# Patient Record
Sex: Female | Born: 1975 | Race: White | Hispanic: No | Marital: Married
Health system: Midwestern US, Academic
[De-identification: ages and names within clinical notes are randomized; demographics above are authoritative.]

---

## 2017-05-03 MED ORDER — FLECAINIDE 50 MG PO TAB
50 mg | ORAL_TABLET | Freq: Two times a day (BID) | ORAL | 11 refills | 30.00000 days | Status: AC
Start: 2017-05-03 — End: ?

## 2017-08-27 ENCOUNTER — Ambulatory Visit: Admit: 2017-08-27 | Discharge: 2017-08-28 | Payer: BC Managed Care – PPO

## 2017-08-27 ENCOUNTER — Encounter: Admit: 2017-08-27 | Discharge: 2017-08-27 | Payer: BC Managed Care – PPO

## 2017-08-27 DIAGNOSIS — Z720 Tobacco use: ICD-10-CM

## 2017-08-27 DIAGNOSIS — G43909 Migraine, unspecified, not intractable, without status migrainosus: ICD-10-CM

## 2017-08-27 DIAGNOSIS — I471 Supraventricular tachycardia: Principal | ICD-10-CM

## 2017-08-27 DIAGNOSIS — I4891 Unspecified atrial fibrillation: Principal | ICD-10-CM

## 2017-08-27 DIAGNOSIS — R002 Palpitations: ICD-10-CM

## 2017-08-27 DIAGNOSIS — I341 Nonrheumatic mitral (valve) prolapse: ICD-10-CM

## 2017-08-27 DIAGNOSIS — E876 Hypokalemia: ICD-10-CM

## 2017-08-27 DIAGNOSIS — F418 Other specified anxiety disorders: ICD-10-CM

## 2017-08-27 DIAGNOSIS — G2581 Restless legs syndrome: ICD-10-CM

## 2017-08-27 MED ORDER — LIDOCAINE HCL 2 % MM JELP
Freq: Once | TOPICAL | 0 refills | Status: CN
Start: 2017-08-27 — End: ?

## 2017-08-27 MED ORDER — LIDOCAINE (PF) 10 MG/ML (1 %) IJ SOLN
.1-2 mL | INTRAMUSCULAR | 0 refills | Status: CN | PRN
Start: 2017-08-27 — End: ?

## 2017-08-27 MED ORDER — SODIUM CHLORIDE 0.9 % IV SOLP
INTRAVENOUS | 0 refills | Status: CN
Start: 2017-08-27 — End: ?

## 2017-08-31 ENCOUNTER — Encounter: Admit: 2017-08-31 | Discharge: 2017-08-31 | Payer: BC Managed Care – PPO

## 2017-08-31 NOTE — Telephone Encounter
I LVM for her with the following information-    Routinely patient's who have SVT ablation may return to work 3 days after procedure.  I left our # as call back if further questions/concerns

## 2017-08-31 NOTE — Telephone Encounter
-----   Message from Jeneen RinksJessica Bower sent at 08/31/2017 12:18 PM CDT -----  Regarding: RAD-Ablation questions  Patient is scheduled for ablation on 09/23/17. She needs to know how long to take off work.  Call back is [phone] okay to LM.

## 2017-09-09 ENCOUNTER — Encounter: Admit: 2017-09-09 | Discharge: 2017-09-09 | Payer: BC Managed Care – PPO

## 2017-09-09 NOTE — Progress Notes
9-20 called  [employer] 1 [phone] [id number] regarding [id number] spoke with Netta A. was told NO Pre Cert needed call REF# [id number], patients effective date 12-21-2014

## 2017-09-18 LAB — CBC
Lab: 12
Lab: 14 — ABNORMAL LOW (ref 7.0–18.7)
Lab: 270
Lab: 30
Lab: 32 — ABNORMAL LOW (ref 33.0–37.0)
Lab: 45
Lab: 6.6 mL/min (ref >60)
Lab: 92

## 2017-09-18 LAB — BASIC METABOLIC PANEL
Lab: 139 mg/dL (ref 0.4–1.00)
Lab: 4.3 mg/dL (ref 8.5–10.6)

## 2017-09-18 LAB — MAGNESIUM: Lab: 2.2 mL/min (ref >60)

## 2017-09-19 ENCOUNTER — Encounter: Admit: 2017-09-19 | Discharge: 2017-09-19 | Payer: BC Managed Care – PPO

## 2017-09-19 DIAGNOSIS — I471 Supraventricular tachycardia: Principal | ICD-10-CM

## 2017-09-19 MED ORDER — MAGNESIUM HYDROXIDE 2,400 MG/10 ML PO SUSP
10 mL | ORAL | 0 refills | Status: CN | PRN
Start: 2017-09-19 — End: ?

## 2017-09-19 MED ORDER — ACETAMINOPHEN 325 MG PO TAB
650 mg | ORAL | 0 refills | Status: CN | PRN
Start: 2017-09-19 — End: ?

## 2017-09-19 MED ORDER — ALUMINUM-MAGNESIUM HYDROXIDE 200-200 MG/5 ML PO SUSP
30 mL | ORAL | 0 refills | Status: CN | PRN
Start: 2017-09-19 — End: ?

## 2017-09-19 NOTE — H&P (View-Only)
Patient presents for procedure. Please see most recent H/P from 09/07 below.     Sandy Parkinson, APRN-C  Pager [phone]     _____________________________________________________________________________    Office Visit     08/27/2017  [facility]   Dendi, Raghuveer, MD   Cardiology   SVT (supraventricular tachycardia) (HCC) +2 more   Dx   Atrial fibrillation , SVT; Referred by Sharlyne Pacas, DO   Reason for Visit    Progress Notes   Jen Mow, MD (Physician) ??? ??? Cardiology ??? ??? 08/27/17 1340 ??? ??? Signed      Date of Service: 08/27/2017  ???  Martisha Toulouse is a 41 y.o. female.     ???  HPI  I had the pleasure of seeing Ms. Quenten Raven in our office today for cardiac electrophysiology FOLLOW UP consultation in response to a request from her primary physician, Dr. Frederica Kuster, regarding recurrent palpitations and a history of SVT and possible atrial fibrillation.   ???  As you recall, she is an extremely delightful 41 year old woman who has a known history of longstanding syncope suggestive of neurocardiogenic syncope, history of tobacco abuse of 1 pack per day for the last 13 years, and a history of anxiety disorder since age 46 requiring medication, who was originally diagnosed with a supraventricular tachycardia with rates up to 220 beats per minute and was taken to EP study and ablation in February 2017 by Dr. Ethelene Hal in [facility]. ???Based on the study and mapping, he felt she had focal atrial tachycardia coming from the lateral right atrium and it was ablated. ???However, he told her that he was only 80% successful and continued the atenolol. ???Unfortunately, she continued to have similar symptoms as before. ???She has had a Holter monitor and a recent event monitor which is ongoing. ???She is here for a second opinion.   ???  Her symptoms include skipping beats, occasional racing, tiredness. ???Frequency is variable. ???It can occur back to back in 1 day versus once a week, averaging around 3-4 times a month. ???Duration is about 15-30 seconds each time. ???She had 2 episodes yesterday. ???These occurred in the setting of excessive heat and working all day and gardening. ???The longest was probably a minute.   ???  Her event monitor is done  But not reviewed.Marland Kitchen   ???  There are no specific triggers or relieving factors for her arrhythmias.   ???  She does not have any thyroid abnormalities. ???I did not ask about any stimulants or caffeine.   ???  She had a stress test less than 5 years ago that was negative for any ischemia.   ???  May 03, 2017:  When I first saw her, I wanted to wait until she finished her event monitor and I reviewed the recordings.  In the interim, I wanted her to take 50 mg twice a day of flecainide and continue the atenolol.  I wanted her to come back for followup in a month.  I also asked her to stop smoking.   ???  She is here for a followup visit today and tells me that she continues to have palpitations.  There are 2 kinds.  One is a constant racing up to 45 seconds or longer, which is more bothersome, and subsequently she feels like she has a kick in the chest.  She also has other types of palpitations which are fluttering or skipped extra beats.  ???  ([id number])  ???  ???  IMPRESSION/PLAN:  ???  Echocardiogram was performed several times, including most recent one in February 2017, which showed normal LV function with EF 65%, normal left atrial size, and no valvular abnormalities, even though she was previously told that she had possible mitral valve prolapse.   ???  The EP study report was reviewed from January 24, 2016. ???As mentioned above, she had inducible atrial tachycardia that was nonsustained first, at 290 msec cycle length, and subsequently sustained with Isuprel. ???Mapping suggested the earliest signal in the right atrium, in the right superior aspect, and ablation was performed in that area with termination of tachycardia, but a repeat induction showed another tachycardia at 340 msec cycle length, and mapping seemed to be just lateral to the prior ablation, where ablation was performed again. ???It was felt that this could lead to damage to the sinus node. ???Therefore, further ablation was not performed.   ???  Lab data from April 2018 showed normal CBC, BUN and creatinine, and liver enzymes. ???Cholesterol was 168. ???Electrolytes were within normal limits. ???Thyroid function test was normal. ???HDL was 63, LDL was 97.   ???  I reviewed the 48-hour Holter monitor, which showed predominantly normal sinus rhythm with intermittent premature ventricular ectopy, with a total PVC burden of 4.9% and monomorphic. ???No atrial fibrillation or SVT was documented in those 48 hours. ???She had some sinus tachycardia.   ???  1. 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  2. History of symptomatic PVCs, with a low burden of 4.9% on the Holter, monomorphic.  3. Anxiety disorder. ???  4. History of neurocardiogenic syncope since age 10.  5. Tobacco abuse.   ???  I reviewed her event monitor results and strips today.  It was quite voluminous data, but we were able to screen through the tracings and clearly identified that she has very rapid narrow-complex tachycardia, consistent with supraventricular tachycardia, going around 200 beats per minute or around 280 msec cycle length.  Onset seems to be with a PAC, but no long AH interval.  Termination seems to be with a QRS.  It appears like a short RP tachycardia, although the VA interval may be slightly longer than 70 msec.   ???  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  It could be most likely AVRT with concealed bypass tract pathway, but AVNRT cannot be ruled out, especially atypical AVNRT.  It could also be atrial tachycardia from the left atrium.  Obviously, she would benefit from an EP study and ablation for a second time.  We would be ready with right atrial and left atrial mapping, as well as multipolar mapping with a PentaRay.  She will be under conscious sedation in the beginning of the study, and we will have anesthesia as a backup if needed.  She understands the benefits and risks of the procedure, including but not limited to infection, bleeding, cardiac perforation, complete heart block requiring pacemaker, etc.  She understands we may have to go through the left atrium and do a transseptal puncture if needed.  She will hold the flecainide for 3 days prior to the procedure, and atenolol on the morning of the procedure.  She is willing to go ahead, and we will schedule her accordingly.  ???  ([id number])  ???  ???  ???  ???  ???       Vitals:   ??? 08/27/17 1323 08/27/17 1338   BP: 104/64 108/66   Pulse: ??? 68  Weight: 60.3 kg (133 lb) ???   Height: 1.727 m (5' 8) ???   ???  Body mass index is 20.22 kg/m???.   ???  Past Medical History        Patient Active Problem List   ??? Diagnosis Date Noted   ??? Atrial fibrillation (HCC) 04/28/2017   ??? SVT (supraventricular tachycardia) (HCC) 04/28/2017   ??? ??? 01/24/2016 EP study with RFA by Dr. Ethelene Hal at [facility]   ???   ??? Mitral valve prolapse 04/28/2017   ??? ??? 01/24/2016 Echo Normal left ventricular size.  Normal left ventricular systolic function.  EF 65%.  Mitral valve demonstrates normal function with trace physiologic regurgitation.   ???   ??? Tobacco abuse 04/28/2017   ??? 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 Hypokalemia 04/28/2017   ??? Palpitation 04/28/2017   ??? Restless leg syndrome 04/28/2017   ??? Migraine 04/28/2017   ???  ???  No Known Allergies  Family History   Problem Relation Age of Onset   ??? Heart Attack Mother    ??? Blood Clots Mother    ??? Hypertension Mother    ??? High Cholesterol Mother    ??? Heart Surgery Maternal Uncle    ??? Heart Surgery Maternal Grandmother    ??? Heart Attack Maternal Grandmother    ??? Heart Disease Maternal Grandmother ??? Heart Surgery Maternal Uncle    ??? Heart Surgery Maternal Uncle      No past surgical history on file.  Social History     Social History   ??? Marital status: Divorced     Spouse name: N/A   ??? Number of children: N/A   ??? Years of education: N/A     Social History Main Topics   ??? Smoking status: Current Every Day Smoker     Packs/day: 1.00     Types: Cigarettes   ??? Smokeless tobacco: Never Used   ??? Alcohol use No   ??? Drug use: No   ??? Sexual activity: Not on file     Other Topics Concern   ??? Not on file     Social History Narrative   ??? No narrative on file       ???  Review of Systems   Constitution: Positive for malaise/fatigue and night sweats.   HENT: Positive for ear pain, hearing loss and tinnitus.    Eyes: Negative.    Cardiovascular: Positive for claudication, irregular heartbeat, leg swelling and near-syncope.   Respiratory: Negative.    Endocrine: Negative.    Hematologic/Lymphatic: Bruises/bleeds easily.   Skin: Negative.    Musculoskeletal: Positive for muscle cramps and neck pain.   Gastrointestinal: Negative.    Genitourinary: Positive for decreased libido and missed menses.   Neurological: Positive for light-headedness.   Psychiatric/Behavioral: Positive for memory loss. The patient has insomnia.    Allergic/Immunologic: Positive for environmental allergies.   ???  ???  Physical Exam  Patient is a moderately well-built woman who is comfortable at rest,  not in any distress.  Sclerae anicteric.  The oral mucosa is moist and pink.  Neck is  supple without any lymphadenopathy.  Lungs are clear to auscultation bilaterally.  Breath  sounds are normal.  Cardiac exam reveals normal S1, S2 with regular rate and  rhythm.  No murmurs, rubs or gallops noted.  Abdomen:  Soft, nontender, nondistended.  Bowel sounds are present.  Extremities:  No cyanosis, clubbing or edema.  Peripheral  pulses are symmetric.  Skin without any rash.  ???  ???  Cardiovascular Studies  ???  ???  Problems Addressed Today  No diagnosis found.  ??? Assessment and Plan  As above in HPI section.  ???  ???  ???  Current Medications (including today's revisions)  ??? atenolol (TENORMIN) 50 mg tablet Take 50 mg by mouth daily.   ??? cyclobenzaprine (FLEXERIL) 10 mg tablet Take 1 tablet by mouth as Needed.   ??? duloxetine DR (CYMBALTA) 30 mg capsule Take 30 mg by mouth at bedtime daily.   ??? duloxetine DR (CYMBALTA) 60 mg capsule Take 60 mg by mouth every morning.   ??? flecainide (TAMBOCOR) 50 mg tablet Take 1 tablet by mouth twice daily.   ??? hydrOXYzine pamoate (VISTARIL) 50 mg capsule Take 1 capsule by mouth at bedtime daily.   ???  ???

## 2017-09-20 ENCOUNTER — Encounter: Admit: 2017-09-20 | Discharge: 2017-09-20 | Payer: BC Managed Care – PPO

## 2017-09-20 DIAGNOSIS — I471 Supraventricular tachycardia: Principal | ICD-10-CM

## 2017-09-23 ENCOUNTER — Encounter: Admit: 2017-09-23 | Discharge: 2017-09-23 | Payer: BC Managed Care – PPO

## 2017-09-23 ENCOUNTER — Encounter: Admit: 2017-09-23 | Discharge: 2017-09-24 | Payer: BC Managed Care – PPO

## 2017-09-23 DIAGNOSIS — I341 Nonrheumatic mitral (valve) prolapse: ICD-10-CM

## 2017-09-23 DIAGNOSIS — R002 Palpitations: ICD-10-CM

## 2017-09-23 DIAGNOSIS — G2581 Restless legs syndrome: ICD-10-CM

## 2017-09-23 DIAGNOSIS — E876 Hypokalemia: ICD-10-CM

## 2017-09-23 DIAGNOSIS — Z720 Tobacco use: ICD-10-CM

## 2017-09-23 DIAGNOSIS — I471 Supraventricular tachycardia: Principal | ICD-10-CM

## 2017-09-23 DIAGNOSIS — S92309A Fracture of unspecified metatarsal bone(s), unspecified foot, initial encounter for closed fracture: ICD-10-CM

## 2017-09-23 DIAGNOSIS — G43909 Migraine, unspecified, not intractable, without status migrainosus: ICD-10-CM

## 2017-09-23 DIAGNOSIS — F418 Other specified anxiety disorders: ICD-10-CM

## 2017-09-23 DIAGNOSIS — I4891 Unspecified atrial fibrillation: Principal | ICD-10-CM

## 2017-09-23 LAB — PREGNANCY TEST-URINE
Lab: 1 % (ref 1.003–1.035)
Lab: NEGATIVE FL — AB (ref 7–11)

## 2017-09-23 LAB — POC ACTIVATED CLOTTING TIME: Lab: 157 s

## 2017-09-23 MED ORDER — SODIUM CHLORIDE 0.9 % IV SOLP
INTRAVENOUS | 0 refills | Status: DC
Start: 2017-09-23 — End: 2017-09-24

## 2017-09-23 MED ORDER — LIDOCAINE (PF) 10 MG/ML (1 %) IJ SOLN
.1-2 mL | INTRAMUSCULAR | 0 refills | Status: DC | PRN
Start: 2017-09-23 — End: 2017-09-24

## 2017-09-23 MED ORDER — DIPHENHYDRAMINE HCL 50 MG/ML IJ SOLN
25 mg | Freq: Once | INTRAVENOUS | 0 refills | Status: AC | PRN
Start: 2017-09-23 — End: ?

## 2017-09-23 MED ORDER — ONDANSETRON HCL (PF) 4 MG/2 ML IJ SOLN
4 mg | INTRAVENOUS | 0 refills | Status: DC | PRN
Start: 2017-09-23 — End: 2017-09-24

## 2017-09-23 MED ORDER — MORPHINE 2 MG/ML IV CRTG
2 mg | Freq: Once | INTRAVENOUS | 0 refills | Status: AC | PRN
Start: 2017-09-23 — End: ?

## 2017-09-23 MED ORDER — ATENOLOL 50 MG PO TAB
50 mg | Freq: Every day | ORAL | 0 refills | Status: DC
Start: 2017-09-23 — End: 2017-09-24
  Administered 2017-09-24 (×2): 50 mg via ORAL

## 2017-09-23 MED ORDER — FLECAINIDE 50 MG PO TAB
50 mg | Freq: Two times a day (BID) | ORAL | 0 refills | Status: DC
Start: 2017-09-23 — End: 2017-09-24
  Administered 2017-09-24 (×2): 50 mg via ORAL

## 2017-09-23 MED ORDER — LIDOCAINE HCL 2 % MM JELP
Freq: Once | TOPICAL | 0 refills | Status: DC
Start: 2017-09-23 — End: 2017-09-24

## 2017-09-23 MED ORDER — ALUMINUM-MAGNESIUM HYDROXIDE 200-200 MG/5 ML PO SUSP
30 mL | ORAL | 0 refills | Status: DC | PRN
Start: 2017-09-23 — End: 2017-09-24

## 2017-09-23 MED ORDER — PROTAMINE IVPB
20 mg | INTRAVENOUS | 0 refills | Status: AC | PRN
Start: 2017-09-23 — End: ?

## 2017-09-23 MED ORDER — PROTAMINE IVPB
40 mg | INTRAVENOUS | 0 refills | Status: AC | PRN
Start: 2017-09-23 — End: ?

## 2017-09-23 MED ORDER — MAGNESIUM HYDROXIDE 2,400 MG/10 ML PO SUSP
10 mL | ORAL | 0 refills | Status: DC | PRN
Start: 2017-09-23 — End: 2017-09-24

## 2017-09-23 MED ORDER — SODIUM CHLORIDE 0.9 % IV SOLP
INTRAVENOUS | 0 refills | Status: DC
Start: 2017-09-23 — End: 2017-09-24
  Administered 2017-09-23: 15:00:00 1000.000 mL via INTRAVENOUS

## 2017-09-23 MED ORDER — ACETAMINOPHEN 325 MG PO TAB
650 mg | ORAL | 0 refills | Status: DC | PRN
Start: 2017-09-23 — End: 2017-09-24

## 2017-09-23 NOTE — Progress Notes
Patient arrived on unit via ambulation accompanied by RN. Patient transferred to the bed without assistance. Frailty score equals 3  Assessment completed, refer to flowsheet for details. Orders released, reviewed, and implemented as appropriate. Oriented to surroundings, call light within reach. Plan of care reviewed.  Will continue to monitor and assess.

## 2017-09-23 NOTE — Other
Operator:  Jen Mow, MD    Procedure: EP Study; PAT RF Ablation.    Indication/Pre-Operative Diagnosis: Symptomatic paroxysmal AT.    Post-Operative Diagnosis: Same    Presenting Rhythm: SR    Accesses: 2 RFV ( 8.5 Fr & 6 Fr) , 3 LFV ( 11 Fr , 56F, 6 Fr).  No arterial access/stick. 1 Transeptal done under ICE and fluoroscopy. Heparin effect reversed at the end of the procedure with protamine.    Procedure(Findings): RA mapping and LA mapping performed. Earliest location in the upper anterolateral RA. Ablation seems to suppress the tachycardia but does not eliminate it. Several ablation lesions with adequate power delivered. Patient has significant pain. Still has NS PAT for few beats at a time. Study terminated.    Complications: None.  Specimen Removed: None.  Estimated Blood Loss: 20 cc.    Plan: Overnight monitoring. Continue home meds including Flecainide and atenolol.

## 2017-09-24 DIAGNOSIS — R55 Syncope and collapse: ICD-10-CM

## 2017-09-24 DIAGNOSIS — I471 Supraventricular tachycardia: Principal | ICD-10-CM

## 2017-09-24 LAB — CBC
Lab: 4 M/UL — ABNORMAL HIGH (ref 4.0–5.0)
Lab: 7.3 K/UL (ref 4.5–11.0)
Lab: 13 % (ref 11–15)
Lab: 13 g/dL (ref 12.0–15.0)
Lab: 206 10*3/uL (ref 150–400)
Lab: 30 pg (ref 26–34)
Lab: 33 g/dL (ref 32.0–36.0)
Lab: 4.4 M/UL (ref 4.0–5.0)
Lab: 40 % (ref 36–45)
Lab: 6.7 10*3/uL (ref 4.5–11.0)
Lab: 8 FL (ref 7–11)
Lab: 90 FL (ref 80–100)

## 2017-09-24 LAB — BASIC METABOLIC PANEL: Lab: 137 MMOL/L — ABNORMAL LOW (ref 137–147)

## 2017-09-24 MED ORDER — ATENOLOL 50 MG PO TAB
50 mg | ORAL_TABLET | Freq: Every day | ORAL | 3 refills | 33.00000 days | Status: AC
Start: 2017-09-24 — End: 2020-01-01

## 2017-09-24 NOTE — Progress Notes
Patient discharged to home with all belongings.  Discharge instructions, med reconciliation and home wound care instructions given and explained to patient and family both verbally and written.  Accompanied by mother.  No complaints of pain or discomfort.   Bilateral femoral puncture sites remain clean, dry, and intact with no evidence of a bruit or hematoma after ambulation.  Patient escorted to lobby via Transport.  Patient to follow up with [facility]) or on-call physician with any additional questions or concerns.  All contact numbers provided.  Patient and family acceptant of DC instuctions and report understanding to all information.

## 2017-09-29 ENCOUNTER — Encounter: Admit: 2017-09-29 | Discharge: 2017-09-29 | Payer: BC Managed Care – PPO

## 2017-09-29 NOTE — Telephone Encounter
-----   Message from Golda Acre, LPN sent at 24/40/1027 11:29 AM CDT -----  Regarding: RAD- Post op complications.  VM from patient on triage line.  York Spaniel that she is having a lot of complications from ablation on 09-23-17.  Said to call back on cell.

## 2017-09-29 NOTE — Telephone Encounter
Louis Meckel, LPN  [facility] Nurse Ep            VM from patient on triage line returning our call.   She did not say what symptoms she is having.   Said to call back on cell and leave her message on her secure VM of what she needs to do.      Called patient to discuss.  On Sunday night, the patient states that she had heart racing about 15 times over 20 minutes.  Patient states that she had another episode this morning.  She was just concerned because she has never had that many episodes.

## 2017-09-29 NOTE — Telephone Encounter
LV to CB

## 2017-09-29 NOTE — Telephone Encounter
I reviewed with RAD, who recommends increasing flecainide to 100mg  BID for now.  Patient is agreeable to this plan with no further concerns or questions at this time.

## 2017-11-22 ENCOUNTER — Ambulatory Visit: Admit: 2017-11-22 | Discharge: 2017-11-23 | Payer: BC Managed Care – PPO

## 2017-11-22 ENCOUNTER — Encounter: Admit: 2017-11-22 | Discharge: 2017-11-22 | Payer: BC Managed Care – PPO

## 2017-11-22 DIAGNOSIS — G2581 Restless legs syndrome: ICD-10-CM

## 2017-11-22 DIAGNOSIS — E876 Hypokalemia: ICD-10-CM

## 2017-11-22 DIAGNOSIS — I471 Supraventricular tachycardia: ICD-10-CM

## 2017-11-22 DIAGNOSIS — F418 Other specified anxiety disorders: ICD-10-CM

## 2017-11-22 DIAGNOSIS — Z720 Tobacco use: ICD-10-CM

## 2017-11-22 DIAGNOSIS — I341 Nonrheumatic mitral (valve) prolapse: ICD-10-CM

## 2017-11-22 DIAGNOSIS — R002 Palpitations: ICD-10-CM

## 2017-11-22 DIAGNOSIS — I4891 Unspecified atrial fibrillation: Principal | ICD-10-CM

## 2017-11-22 DIAGNOSIS — S92309A Fracture of unspecified metatarsal bone(s), unspecified foot, initial encounter for closed fracture: ICD-10-CM

## 2017-11-22 DIAGNOSIS — G43909 Migraine, unspecified, not intractable, without status migrainosus: ICD-10-CM

## 2018-03-24 ENCOUNTER — Encounter: Admit: 2018-03-24 | Discharge: 2018-03-24 | Payer: BC Managed Care – PPO

## 2018-03-24 DIAGNOSIS — I341 Nonrheumatic mitral (valve) prolapse: ICD-10-CM

## 2018-03-24 DIAGNOSIS — Z720 Tobacco use: ICD-10-CM

## 2018-03-24 DIAGNOSIS — E876 Hypokalemia: ICD-10-CM

## 2018-03-24 DIAGNOSIS — S92309A Fracture of unspecified metatarsal bone(s), unspecified foot, initial encounter for closed fracture: ICD-10-CM

## 2018-03-24 DIAGNOSIS — R002 Palpitations: ICD-10-CM

## 2018-03-24 DIAGNOSIS — I471 Supraventricular tachycardia: ICD-10-CM

## 2018-03-24 DIAGNOSIS — I4891 Unspecified atrial fibrillation: Principal | ICD-10-CM

## 2018-03-24 DIAGNOSIS — G2581 Restless legs syndrome: ICD-10-CM

## 2018-03-24 DIAGNOSIS — F418 Other specified anxiety disorders: ICD-10-CM

## 2018-03-24 DIAGNOSIS — G43909 Migraine, unspecified, not intractable, without status migrainosus: ICD-10-CM

## 2018-04-27 ENCOUNTER — Encounter: Admit: 2018-04-27 | Discharge: 2018-04-27 | Payer: BC Managed Care – PPO

## 2018-06-29 ENCOUNTER — Ambulatory Visit: Admit: 2018-06-29 | Discharge: 2018-06-30 | Payer: BC Managed Care – PPO

## 2018-06-29 ENCOUNTER — Encounter: Admit: 2018-06-29 | Discharge: 2018-06-29 | Payer: BC Managed Care – PPO

## 2018-06-29 DIAGNOSIS — I471 Supraventricular tachycardia: ICD-10-CM

## 2018-06-29 DIAGNOSIS — I4891 Unspecified atrial fibrillation: Principal | ICD-10-CM

## 2018-06-29 DIAGNOSIS — I341 Nonrheumatic mitral (valve) prolapse: ICD-10-CM

## 2018-06-29 DIAGNOSIS — G43909 Migraine, unspecified, not intractable, without status migrainosus: ICD-10-CM

## 2018-06-29 DIAGNOSIS — S92309A Fracture of unspecified metatarsal bone(s), unspecified foot, initial encounter for closed fracture: ICD-10-CM

## 2018-06-29 DIAGNOSIS — G2581 Restless legs syndrome: ICD-10-CM

## 2018-06-29 DIAGNOSIS — E876 Hypokalemia: ICD-10-CM

## 2018-06-29 DIAGNOSIS — F418 Other specified anxiety disorders: ICD-10-CM

## 2018-06-29 DIAGNOSIS — Z720 Tobacco use: ICD-10-CM

## 2018-06-29 DIAGNOSIS — R002 Palpitations: ICD-10-CM

## 2019-05-02 ENCOUNTER — Encounter: Admit: 2019-05-02 | Discharge: 2019-05-02 | Payer: BC Managed Care – PPO

## 2019-05-02 NOTE — Telephone Encounter
-----   Message from Golda Acre, LPN sent at 1/61/0960 12:05 PM CDT -----  Regarding: RAD- virus ?  VM from patient on triage line.  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  He was sent home for 2 weeks.  She called the [facility] covid line and was told to call RAD and get order for her to be tested due to her heart issues.  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  All her at [phone] and will have to leave message if before 4pm.

## 2019-05-02 NOTE — Telephone Encounter
1. You have had no fever for at least 72 hours (that is three full days of no fever without the use medicine that reduces fevers)  AND  2. Other symptoms have improved (for example, when your cough or shortness of breath have improved)  AND   3. At least 7 days have passed since your symptoms first appeared

## 2019-10-21 ENCOUNTER — Encounter: Admit: 2019-10-21 | Discharge: 2019-10-21 | Payer: BC Managed Care – PPO

## 2019-11-06 ENCOUNTER — Encounter: Admit: 2019-11-06 | Discharge: 2019-11-06 | Payer: BC Managed Care – PPO

## 2019-11-06 NOTE — Progress Notes
Records Request    Medical records request for continuation of care:      Please fax records to [facility]    Request records:      Most Recent Labs      Thank you,      Cardiovascular Medicine  [facility]  [address] [place] [address]  Phone:  [phone]  Fax:  [phone]

## 2019-11-07 ENCOUNTER — Encounter: Admit: 2019-11-07 | Discharge: 2019-11-07 | Payer: BC Managed Care – PPO

## 2019-12-28 ENCOUNTER — Encounter: Admit: 2019-12-28 | Discharge: 2019-12-28 | Payer: BC Managed Care – PPO

## 2020-01-01 ENCOUNTER — Encounter: Admit: 2020-01-01 | Discharge: 2020-01-01 | Payer: BC Managed Care – PPO

## 2020-01-01 DIAGNOSIS — I341 Nonrheumatic mitral (valve) prolapse: Secondary | ICD-10-CM

## 2020-01-01 DIAGNOSIS — I471 Supraventricular tachycardia: Secondary | ICD-10-CM

## 2020-01-01 MED ORDER — ATENOLOL 50 MG PO TAB
50 mg | ORAL_TABLET | Freq: Every day | ORAL | 3 refills | 33.00000 days | Status: AC
Start: 2020-01-01 — End: ?

## 2020-01-01 NOTE — Patient Instructions
1. Restart atenolol 50mg  daily.    If stress test is normal, you will restart flecainide.    We would like you to follow up in  6 months    Please schedule the following testing: STRESS TEST    In order to provide you the best care possible we ask that you follow up as below:    For NON-URGENT questions please contact us through your MyChart account.   For all medication refills please contact your pharmacy or send a request through MyChart.   For all questions that may need to be addressed urgently please call the nursing triage line at [phone] Monday - Friday 8-5 only. Please leave a detailed message with your name, date of birth, and reason for your call.    To schedule an appointment call [phone].     Please allow 10-15 business days for the results of any testing to be reviewed. Please call our office if you have not heard from a nurse within this time frame.         [facility] Nuclear Stress Test Instructions    PLEASE REPORT TO:    ____[facility] ([address], [place], [place]) - [phone]   ____[facility] ([address], [address], [place], [place]) - [phone]    ____[facility] ([address] [place], [place]) - [phone]    ____[facility] ([address], [address], [place], [place]) - [phone]   ____[facility] ([address] [place], [place]) - [phone]     [facility] Main Phone Number: [phone]     Date of Test  ____________  at ____________  for ________________________    Are you able to raise your arm up by your head for about 20 minutes? yes  Can you lie on your back for approximately 20 minutes with minimal movement? yes    The Thallium evaluation has two parts -- two nuclear scans.  The first scan is done in the morning and the second three to four hours later.   Wear comfortable clothing. Shorts or pants. (No dresses or skirts please).  Bring or wear sneakers/walking shoes if you are walking on [facility]. Please let the nuclear technologists know if you plan on flying after the test.    NO CAFFEINE 24 HOURS PRIOR TO TEST. Examples: coffee, tea, decaf drinks, cola, chocolate.     DO NOT EAT OR DRINK THE MORNING OF YOUR TEST unless otherwise instructed. (You may have a couple sips of water).    If you are a diabetic, if insulin dependent: please take one third of your insulin with two pieces of dry toast and a small juice). Bring insulin and medication with you to the test.     ___ TAKE MORNING MEDICATIONS WITH A COUPLE SIPS OF WATER PRIOR TO TEST.     HOLD THE FOLLOWING MEDICATIONS AS INDICATED BELOW:         WHAT TO DO BETWEEN THE FIRST TWO THALLIUM SCANS:  1. No strenuous exercise should be performed during this time.  2. A light lunch is permissible. The technologist will give you a list of appropriate foods.  3. Please return 15 minutes prior to the schedule of your second scan. Our nuclear technologist will tell you exactly what time to return.  4. Please do not use tobacco products in between scans.  5. After the first scan is completed, you may resume usual medications.     TEST  FINDINGS:  You will receive the results of the test within 7 business days of its completion by telephone, unless arranged differently at the time of the procedure.  If you have any questions concerning your thallium test or if you do not hear from your C[facility] physician/or nurse within 7 business days, please call the appropriate office checked above.      Instructions given by Maryann Conners, RN

## 2020-01-02 ENCOUNTER — Encounter: Admit: 2020-01-02 | Discharge: 2020-01-02 | Payer: BC Managed Care – PPO

## 2020-01-02 DIAGNOSIS — S92309A Fracture of unspecified metatarsal bone(s), unspecified foot, initial encounter for closed fracture: Secondary | ICD-10-CM

## 2020-01-02 DIAGNOSIS — I4891 Unspecified atrial fibrillation: Secondary | ICD-10-CM

## 2020-01-02 DIAGNOSIS — E876 Hypokalemia: Secondary | ICD-10-CM

## 2020-01-02 DIAGNOSIS — R002 Palpitations: Secondary | ICD-10-CM

## 2020-01-02 DIAGNOSIS — G43909 Migraine, unspecified, not intractable, without status migrainosus: Secondary | ICD-10-CM

## 2020-01-02 DIAGNOSIS — Z720 Tobacco use: Secondary | ICD-10-CM

## 2020-01-02 DIAGNOSIS — G2581 Restless legs syndrome: Secondary | ICD-10-CM

## 2020-01-02 DIAGNOSIS — F418 Other specified anxiety disorders: Secondary | ICD-10-CM

## 2020-01-02 DIAGNOSIS — I341 Nonrheumatic mitral (valve) prolapse: Secondary | ICD-10-CM

## 2020-01-02 DIAGNOSIS — I471 Supraventricular tachycardia: Secondary | ICD-10-CM

## 2020-01-05 ENCOUNTER — Encounter: Admit: 2020-01-05 | Discharge: 2020-01-05 | Payer: BC Managed Care – PPO

## 2020-05-06 ENCOUNTER — Encounter: Admit: 2020-05-06 | Discharge: 2020-05-06 | Payer: BC Managed Care – PPO

## 2022-04-20 ENCOUNTER — Encounter: Admit: 2022-04-20 | Discharge: 2022-04-20 | Payer: BC Managed Care – PPO

## 2022-04-20 NOTE — Telephone Encounter
-----   Message from Golda Acre, LPN sent at 7/0/1779  2:29 PM CDT -----  Regarding: RAD- CC  VM on triage line from [facility] with Dr. Bertram Savin office [phone] at 1:47pm.  York Spaniel that she needs right shoulder surgery and they need cardiac clearance.  Fax to #[phone].

## 2022-04-20 NOTE — Telephone Encounter
Patient is scheduled for right shoulder surgery on TBD with Dr. Selina Cooley, cardiac clearance has been requested.     Patient was last seen on 01/01/20. Patient was to follow up in 6 months after that appointment. This follow up was never scheduled. It appears several attempts were made to schedule this follow up with the patient.       Patient notified that she can not be cleared until she sees [place] back. She is scheduled for appointment with RAD on 5/8. Reviewed plan with the patient. Patient verbalized understanding and does not have any further questions or concerns. No further education requested from patient. Patient has our contact information for future needs.

## 2022-04-21 ENCOUNTER — Encounter: Admit: 2022-04-21 | Discharge: 2022-04-21 | Payer: BC Managed Care – PPO

## 2022-04-21 NOTE — Progress Notes
Records Request    Medical records request for continuation of care:    Patient has appointment  with  Dr. Wallene Huh.    Please fax records to [facility] [place] [facility] [phone]    Request records:    Noheli Melder  10.5.1977    Sleep study 02-2021    Most Recent Labs        Thank you,      Cardiovascular Medicine  [facility]  [address]  [place], [place] [address]  Phone:  [phone]  Fax:  [phone]

## 2022-04-22 ENCOUNTER — Encounter: Admit: 2022-04-22 | Discharge: 2022-04-22 | Payer: BC Managed Care – PPO

## 2022-04-27 ENCOUNTER — Encounter: Admit: 2022-04-27 | Discharge: 2022-04-27 | Payer: BC Managed Care – PPO

## 2022-04-27 DIAGNOSIS — I4891 Unspecified atrial fibrillation: Secondary | ICD-10-CM

## 2022-04-27 DIAGNOSIS — S92309A Fracture of unspecified metatarsal bone(s), unspecified foot, initial encounter for closed fracture: Secondary | ICD-10-CM

## 2022-04-27 DIAGNOSIS — G43909 Migraine, unspecified, not intractable, without status migrainosus: Secondary | ICD-10-CM

## 2022-04-27 DIAGNOSIS — G2581 Restless legs syndrome: Secondary | ICD-10-CM

## 2022-04-27 DIAGNOSIS — R0989 Other specified symptoms and signs involving the circulatory and respiratory systems: Secondary | ICD-10-CM

## 2022-04-27 DIAGNOSIS — Z720 Tobacco use: Secondary | ICD-10-CM

## 2022-04-27 DIAGNOSIS — I341 Nonrheumatic mitral (valve) prolapse: Secondary | ICD-10-CM

## 2022-04-27 DIAGNOSIS — I471 Supraventricular tachycardia: Secondary | ICD-10-CM

## 2022-04-27 DIAGNOSIS — F418 Other specified anxiety disorders: Secondary | ICD-10-CM

## 2022-04-27 DIAGNOSIS — R002 Palpitations: Secondary | ICD-10-CM

## 2022-04-27 DIAGNOSIS — E876 Hypokalemia: Secondary | ICD-10-CM

## 2022-04-27 NOTE — Progress Notes
Date of Service: 04/27/2022    Sandy Gaines is a 46 y.o. female.       HPI     I had the pleasure of seeing Ms. Sandy Gaines in our office today for cardiac electrophysiology FOLLOW UP consultation in response to a request from her primary physician, Dr. Frederica Kuster, regarding recurrent palpitations and a history of SVT and possible atrial fibrillation.   ?  As you recall, she is an extremely delightful 46 year old woman who has a known history of longstanding syncope suggestive of neurocardiogenic syncope, history of tobacco abuse of 1 pack per day for the last 13 years, and a history of anxiety disorder since age 62 requiring medication, who was originally diagnosed with a supraventricular tachycardia with rates up to 220 beats per minute and was taken to EP study and ablation in February 2017 by Dr. Ethelene Hal in [facility]. ?Based on the study and mapping, he felt she had focal atrial tachycardia coming from the lateral right atrium and it was ablated. ?However, he told her that he was only 80% successful and continued the atenolol. ?Unfortunately, she continued to have similar symptoms as before. ?She has had a Holter monitor and a recent event monitor which is ongoing. ?She is here for a second opinion.   ?  Her symptoms include skipping beats, occasional racing, tiredness. ?Frequency is variable. ?It can occur back to back in 1 day versus once a week, averaging around 3-4 times a month. ?Duration is about 15-30 seconds each time. ?She had 2 episodes yesterday. ?These occurred in the setting of excessive heat and working all day and gardening. ?The longest was probably a minute.   ?  Her event monitor is done ?But not reviewed..   ?  There are no specific triggers or relieving factors for her arrhythmias.   ?  She does not have any thyroid abnormalities. ?I did not ask about any stimulants or caffeine.   ?  She had a stress test less than 5 years ago that was negative for any ischemia.   ?  May 03, 2017: ?When I first saw her, I wanted to wait until she finished her event monitor and I reviewed the recordings. ?In the interim, I wanted her to take 50 mg twice a day of flecainide and continue the atenolol. ?I wanted her to come back for followup in a month. ?I also asked her to stop smoking.   ?  September 2018: ?I saw her for a followup visit and reviewed her event monitor results. ?She clearly seemed to have very rapid SVT, up to 200 beats per minute, and so we felt that she would benefit from a repeat EP study and ablation.   ?  We took her to the EP lab for ablation on September 23, 2017.??She had repeated runs of focal atrial tachycardia coming from the right atrium in the anterolateral aspect. ?It was not necessarily close to sinus node, but every time we put the catheter there to ablate, it would terminate with mechanical contact itself; therefore, we had to ablate empirically, and every time we ablated, she had significant pain in the shoulder. ?This limited the amount of ablation we could do, and eventually, once we did enough ablation, it seems to have reduced in frequency as well as intensity, even though she still was having very short runs at that point. ?We terminated the procedure and continued her flecainide and atenolol, and I asked her to come back for followup.   ?  She had a significant episode of increased frequency, racing, a week after the procedure, and therefore, we increase the flecainide dose. ?Since then, she says that the frequency has significantly decreased, and she only has episode on average once a week, and it only lasts 30-45 seconds. ?She is overall happy with the current status and is able to manage. 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   ?  When I saw her in December 2018, I recommended that she continue flecainide and atenolol for the time being because of a history of paroxysmal atrial tachycardia in?spite of attempted ablation. ?Since the ablation was limited by pain, we recommended that we may have to bring her back to the lab with general anesthesia.   ?  I recommended that she undergo a stress test to rule out ischemia, to continue to use the flecainide.  However, she did not make it to the appointment and did not reschedule it either.   ?  She tells me that about 7 months ago, her pharmacy changed and prescriptions did not get transferred, and so she stopped taking the medications instead of calling us.  In the absence of medications, she has been having more racing off and on, average 1-2 times per day, lasting for 20 seconds to 45 seconds.   ?  She had syncopal episode 3-4 weeks ago while she was walking inside from outside at work after opening the garage door.  She started having some racing sensation, tunnel vision, and kind of fell sideways with loss of consciousness less than 1 minute.  She had a bad headache after that and the racing resolved spontaneously, and she had fatigue for the rest of the day.   ?  Her depression came back, and therefore, she was recently restarted on Cymbalta by her PCP, Dr. Charmian Muff.   ?  She drinks 4-5 bottles of 20-ounce water every day.  She also drinks a large amount of tea 20 ounces 3 times in the morning, and 20 ounces 1 time in the evening.  She does not consume alcohol.  She continues to smoke at a half pack per day, which we recommended to cut in the past.   ?  When I last saw her in January 2021, she was noncompliant with her medications.  We restarted overall atenolol and recommended a stress test so that she can be started on flecainide.  However, she never did the stress test and in fact took herself off of flecainide and atenolol about 1 year ago.  She felt mild withdrawal symptoms initially but then did not have any problems with arrhythmias therefore she stayed off of all her medications.  She tells me that she only had 1 episode of heart racing in the last 6 months that lasted for 35 seconds.    She is here after 2 years primarily because she needs preoperative cardiovascular clearance in preparation for right rotator cuff surgery next week in [facility].  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  She continues to work and putting up to 14,000 steps per day without any limitation of chest pain or shortness of breath apparently.  Unfortunately she continues to smoke 1 pack/day.  She has tried to quit in the past but was unsuccessful.    She denies any chest pain, shortness of breath, palpitations  or leg swelling.    IMPRESSION/PLAN:    I reviewed her twelve-lead EKG which shows normal sinus rhythm at a rate of 71 bpm PR 130 QRS 68 QTc 434 ms.    Lab work shows normal BMP and CBC.  Her LDL is elevated at 145.    Echocardiogram was performed  most recent one in February 2017, which showed normal LV function with EF 65%, normal left atrial size, and no valvular abnormalities    1.  History of paroxysmal atrial tachycardia status post attempted ablation in the right anterolateral atrium in 2018.  This was limited by pain issues.  Therefore we had her on low-dose flecainide and atenolol but she has taken herself off of it for more than a year and did not have any significant arrhythmias.  2.  Coronary risk factors include tobacco use, hyperlipidemia and family history of CAD although her mom had a stent when she was in mid 50s.  3.  Patient has no active signs of ischemia on the EKG or based on the symptoms.  She is very active and putting up to 14,000 steps per day without any limitation.  Therefore she will be considered low risk for any perioperative cardiovascular complications.  If she does have any arrhythmia, beta-blockers or calcium channel blockers can be used.  4.  Hyperlipidemia: She was recommended dietary modification of the diet including low-fat and low carbohydrates and repeat lipid profile in 6 months.  5.  Tobacco use: 1 pack/day.  She has been working with the primary care physician to quit smoking but she has not been successful.  I encouraged her to try again.    I will see her back in 6 months or sooner if needed.  If she needs an antiarrhythmic drug, we cannot prescribe a 1C drug until she gets a stress test.  Alternative drug therapy like Multaq or sotalol can be utilized.  First-line therapy would be diltiazem or beta-blockers.  Thank you for letting us participate in the care of your patient.               Vitals:    04/27/22 1602   BP: 122/66   BP Source: Arm, Left Upper   Pulse: 58   SpO2: 94%   O2 Percent: 94 %   O2 Device: None (Room air)   PainSc: Five   Weight: 65.6 kg (144 lb 9.6 oz)   Height: 172.7 cm (5' 8)     Body mass index is 21.99 kg/m?Marland Kitchen     Past Medical History  Patient Active Problem List    Diagnosis Date Noted   ? Atrial tachycardia (HCC) 09/23/2017     09/23/17 Atach RFA - Ablation seems to suppress the tachycardia but does not eliminate it. Several ablation lesions with adequate power delivered. Patient has significant pain. Still has NS PAT for few beats at a time. Study terminated. Continue Flecainide and Atenolol       ? Atrial fibrillation (HCC) 04/28/2017   ? SVT (supraventricular tachycardia) (HCC) 04/28/2017     01/24/2016 EP study with RFA by Dr. Ethelene Hal at [facility]      ? Mitral valve prolapse 04/28/2017     01/24/2016 Echo Normal left ventricular size.  Normal left ventricular systolic function.  EF 65%.  Mitral valve demonstrates normal function with trace physiologic regurgitation.      ? Tobacco abuse 04/28/2017   ? 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 Hypokalemia 04/28/2017   ? Palpitation 04/28/2017   ?  Restless leg syndrome 04/28/2017   ? Migraine 04/28/2017         Review of Systems   Constitutional: Negative.   HENT: Negative.    Eyes: Negative.    Cardiovascular: Negative.    Respiratory: Negative.    Endocrine: Negative. Hematologic/Lymphatic: Negative.    Skin: Negative.    Musculoskeletal: Negative.    Gastrointestinal: Negative.    Genitourinary: Negative.    Neurological: Negative.    Psychiatric/Behavioral: Negative.    Allergic/Immunologic: Negative.        Physical Exam  Patient is a moderately well-built woman who is comfortable at rest,  not in any distress.  Sclerae anicteric.  The oral mucosa is moist and pink.  Neck is  supple without any lymphadenopathy.  Lungs are clear to auscultation bilaterally.  Breath  sounds are normal.  Cardiac exam reveals normal S1, S2 with regular rate and  rhythm.  No murmurs, rubs or gallops noted.  Abdomen:  Soft, nontender, nondistended.  Bowel sounds are present.  Extremities:  No cyanosis, clubbing or edema.  Peripheral  pulses are symmetric.  Skin without any rash.    Cardiovascular Studies      Cardiovascular Health Factors  Vitals BP Readings from Last 3 Encounters:   04/27/22 122/66   01/01/20 126/76   06/29/18 118/70     Wt Readings from Last 3 Encounters:   04/27/22 65.6 kg (144 lb 9.6 oz)   01/01/20 58.5 kg (129 lb)   06/29/18 61.2 kg (135 lb)     BMI Readings from Last 3 Encounters:   04/27/22 21.99 kg/m?   01/01/20 19.61 kg/m?   06/29/18 20.53 kg/m?      Smoking Social History     Tobacco Use   Smoking Status Every Day   ? Packs/day: 1.00   ? Years: 15.00   ? Pack years: 15.00   ? Types: Cigarettes   Smokeless Tobacco Never      Lipid Profile Cholesterol   Date Value Ref Range Status   12/08/2021 202 (H) <=200 Final     HDL   Date Value Ref Range Status   12/08/2021 63  Final     LDL   Date Value Ref Range Status   12/08/2021 145 (H) <=100 Final     Triglycerides   Date Value Ref Range Status   12/08/2021 64  Final      Blood Sugar Hemoglobin A1C   Date Value Ref Range Status   12/08/2021 4.9  Final     Glucose   Date Value Ref Range Status   12/08/2021 93  Final   07/21/2018 85  Final   09/24/2017 102 (H) 70 - 100 MG/DL Final          Problems Addressed Today  Encounter Diagnoses   Name Primary?   ? Cardiovascular symptoms Yes   ? SVT (supraventricular tachycardia) (HCC)        Assessment and Plan     As above in HPI section.         Current Medications (including today's revisions)  ? celecoxib (CELEBREX) 200 mg capsule Take one capsule by mouth daily.   ? CHOLEcalciferoL (vitamin D3) (VITAMIN D-3) 5000 unit tablet Take one tablet by mouth daily.   ? cyclobenzaprine (FLEXERIL) 10 mg tablet Take one tablet by mouth as Needed.   ? duloxetine DR (CYMBALTA) 60 mg capsule Take two capsules by mouth every morning.   ? hydrOXYzine pamoate (VISTARIL) 50 mg capsule Take one capsule  by mouth at bedtime daily.

## 2022-04-28 ENCOUNTER — Encounter: Admit: 2022-04-28 | Discharge: 2022-04-28 | Payer: BC Managed Care – PPO

## 2022-04-28 NOTE — Progress Notes
RAD cleared patient yesterday in OV note. I faxed note to Dr. Sharen Hones office at this time.

## 2022-09-01 ENCOUNTER — Encounter: Admit: 2022-09-01 | Discharge: 2022-09-01 | Payer: BC Managed Care – PPO

## 2022-09-02 ENCOUNTER — Encounter: Admit: 2022-09-02 | Discharge: 2022-09-02 | Payer: BC Managed Care – PPO

## 2022-09-02 NOTE — Telephone Encounter
-----   Message from [facility] Meerdink sent at 09/02/2022  7:34 AM CDT -----  Regarding: RAD-RC @ 4:50PM

## 2022-09-02 NOTE — Telephone Encounter
Called back and left VM. It does not look like anyone called her.

## 2023-03-03 ENCOUNTER — Encounter: Admit: 2023-03-03 | Discharge: 2023-03-03 | Payer: BC Managed Care – PPO

## 2023-03-04 ENCOUNTER — Encounter: Admit: 2023-03-04 | Discharge: 2023-03-04 | Payer: BC Managed Care – PPO

## 2023-03-04 NOTE — Progress Notes
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  Sandy Gaines that she had ECG done, and is wearing 72 hour heart monitor.  She is scheduled for right shoulder on 03-30-23 with Dr. Drema Dallas with [facility].  Said that she is having irregular HR and feels it.  August appointment will not cut it, she needs seen sooner.  [name] needs fax # to send records and fax # to get records from [place].  Call [place] at [phone].

## 2023-03-04 NOTE — Progress Notes
Spoke to [place].  Requested information provided.  Discussed her sooner OV can be scheduled with APP.  Will follow-up once we receive EKG's if indicated.      Patient notified.  Offered OV with Mickle Mallory, APRN on 3/28.  Patient ageeable to time/date/location.

## 2023-03-04 NOTE — Progress Notes
RC to both patient and [name].  LVM to CB.

## 2023-03-04 NOTE — Progress Notes
-----   Message -----  From: Louis Meckel, LPN  Sent: [id number]   4:30 PM CDT  To: Cvm Nurse Ep Team C  Subject: RAD- CC                                          VM on triage line from patient at 4:23pm today.  She is having surgery on 03-30-23 and needs cardiac clearance.  She saw PCP and they did 4 EKG and they did not like them.  They are having her wear heart monitor.  I need to see RAD as I'm having heart issues.  Call her at [phone].

## 2023-03-04 NOTE — Progress Notes
RAD- RC at 3:01pm from [facility]. Call her at [phone].

## 2023-03-04 NOTE — Progress Notes
RAD- call from Jerene Dilling at 3:59pm, she went to answer your call and hung up on you. So sorry. Call her at [phone].

## 2023-03-04 NOTE — Progress Notes
!  STAT!    Request for the following medical records, for the purpose Continuity of Care.     Please send the following:     EKG's       Please Fax to:   FAX#: [phone]  Attn: Dola Argyle

## 2023-03-04 NOTE — Progress Notes
RC. Unable to LVM.

## 2023-03-11 ENCOUNTER — Encounter: Admit: 2023-03-11 | Discharge: 2023-03-11 | Payer: BC Managed Care – PPO

## 2023-03-11 NOTE — Progress Notes
STAT     Request for the following medical records, for the purpose Continuity of Care.     Please send the following:     Most Recent Holter Monitor report with tracings     Please Fax to:   FAX#: [phone]  Attn: Judeen Hammans

## 2023-03-17 ENCOUNTER — Encounter: Admit: 2023-03-17 | Discharge: 2023-03-17 | Payer: BC Managed Care – PPO

## 2023-03-17 ENCOUNTER — Ambulatory Visit: Admit: 2023-03-17 | Discharge: 2023-03-17 | Payer: Worker's Compensation

## 2023-03-17 DIAGNOSIS — I471 SVT (supraventricular tachycardia) (HCC): Secondary | ICD-10-CM

## 2023-03-17 DIAGNOSIS — Z01810 Encounter for preprocedural cardiovascular examination: Secondary | ICD-10-CM

## 2023-03-17 DIAGNOSIS — Z720 Tobacco use: Secondary | ICD-10-CM

## 2023-03-17 DIAGNOSIS — R002 Palpitations: Secondary | ICD-10-CM

## 2023-03-17 DIAGNOSIS — G2581 Restless legs syndrome: Secondary | ICD-10-CM

## 2023-03-17 DIAGNOSIS — E876 Hypokalemia: Secondary | ICD-10-CM

## 2023-03-17 DIAGNOSIS — F418 Other specified anxiety disorders: Secondary | ICD-10-CM

## 2023-03-17 DIAGNOSIS — I341 Nonrheumatic mitral (valve) prolapse: Secondary | ICD-10-CM

## 2023-03-17 DIAGNOSIS — G43909 Migraine, unspecified, not intractable, without status migrainosus: Secondary | ICD-10-CM

## 2023-03-17 DIAGNOSIS — I4891 Unspecified atrial fibrillation: Secondary | ICD-10-CM

## 2023-03-17 DIAGNOSIS — Z79899 Other long term (current) drug therapy: Secondary | ICD-10-CM

## 2023-03-17 DIAGNOSIS — S92309A Fracture of unspecified metatarsal bone(s), unspecified foot, initial encounter for closed fracture: Secondary | ICD-10-CM

## 2023-03-17 MED ORDER — METOPROLOL SUCCINATE 25 MG PO TB24
25 mg | ORAL_TABLET | Freq: Every day | ORAL | 3 refills | 90.00000 days | Status: AC
Start: 2023-03-17 — End: ?

## 2023-03-17 NOTE — Patient Instructions
Follow-Up:    -Thank you for allowing [place] to participate in your care today. Your After Visit Summary is being completed by Lieutenant Diego, RN.    -We would like you to follow up in  3 months with Dr. Wallene Huh  -The schedule is released approximately 4-5 months in advance. You will be called by our scheduling department to make an appointment and you will also receive a notification via MyChart to self-schedule.  However, if you would like to call to make this appointment, please call [phone].    -Please schedule the following testing at check out, or by calling our scheduling line: STRESS TEST    Changes From Today's Office Visit    - Start Metoprolol Succinate 25mg  once a day.  - Schedule Stress Test - you can have this in [place]    [facility]    Nuclear Stress Test Instructions    Your cardiologist has asked that you have a nuclear stress test (also known as a Myocardial Perfusion Imaging (MPI) test.    This evaluation of your heart muscle consists of two sets of nuclear images and either a Treadmill stress test or a chemical stress test, decided by you and your physician.    You will get an IV placed in your arm for the test.    You will need to be able to raise your arm up by your head for about 20 minutes and lie on your back for about 10 minutes.  Please discuss this with your doctor or talk with the nuclear technologist or nurse if these are a problem for you.    It is recommended not to schedule any other appointment for the same day.      Wear comfortable clothing and walking shoes if you are walking on the treadmill.  Women should wear shorts or comfortable pants instead of dresses.   Sweatshirts or T-shirts work really well for imaging.    If you have a Zio Patch cardiac monitor in place, please reschedule your nuclear stress test after your monitoring period is complete. If you would like to proceed with the nuclear stress test during the monitoring time, the patch will have to be removed and the data will be lost; we can place a new patch following testing.       If you have a cardiac monitor with replacement patches, please inform the nurse prior to your appointment.  The monitor will need to be removed during testing. Please bring replacement patches with you so that we can resume monitoring following your test.     There may be enough time to leave to get a snack after the stress portion of the test.   The Technologist will tell you what time to return for the second set of images.    PLEASE NO CAFFEINE 24 HOURS PRIOR TO TEST:  Examples include coffee, tea, decaffeinated drinks, colas, [facility], Dr. Reino Kent.  Some orange sodas and root beers have caffeine, please check.  No energy drinks, Excedrin, Midol, or any foods CONTAINING CHOCOLATE.   Consuming Caffeine may postpone your test.    PLEASE DO NOT EAT OR DRINK THE MORNING OF YOUR TEST.  Water is ok to drink with your morning medications.    Please hold these medications the day of test: Metoprolol    DIABETIC PATIENTS:  if insulin dependent:  please take one third of your insulin with two pieces of dry toast and a small juice.  Bring  remaining 2/3   insulin and oral diabetic medications with you to your test.    PLEASE NO TOBACCO PRODUCTS BETWEEN SCANS  You will NOT need a driver for this test.  But are welcome to bring a visitor with you.   Visitors will not be able to accompany you back to stress room.   Please do not bring children to the nuclear stress test.    TEST FINDINGS:   You will receive the results of the test within 7 business days of completion of this test by telephone. If you have any questions concerning your nuclear stress test or if you do not hear from your cardiologist/or nurse with 7 business days, please call our office.    Contacting our office:    -For NON-URGENT questions please contact us via message through your MyChart account.   -For all medication refills please contact your pharmacy or send a request through MyChart.     -For all questions that may need to be addressed urgently please call the [facility] nursing triage line at [phone] Monday - Friday 8am-5pm only. Please leave a detailed message with your name, date of birth, and reason for your call.  If your message is received before 3:30pm, every effort will be made to call you back the same day.  Please allow time for [place] to review your chart prior to call back.     -Should you have an urgent concern over the weekend/nights, the on-call triage line is [phone].    Doylene Canning team fax number: [phone]    -You may receive a survey in the upcoming weeks from [facility]. Your feedback is important to [place] and helps [place] continue to improve patient care and patient satisfaction.     -Please feel free to call our Financial Department at [phone] with any questions or concerns about estimated cost of testing or imaging ordered today. We are happy to provide CPT codes upon request.    Results & Testing Follow Up:    -Please allow 5-7 business days for the results of any testing to be reviewed. Please call our office if you have not heard from a nurse within this time frame.    -Should you choose to complete testing at an outside facility, please contact our office after completion of testing so that we can ensure that we have received results for your provider to review.    Lab and test results:  As a part of the CARES act, starting 03/21/2020, some results will be released to you via MyChart immediately and automatically.  You may see results before your provider sees them; however, your provider will review all these results and then they, or one of their team, will notify you of result information and recommendations.   Critical results will be addressed immediately, but otherwise, please allow [place] time to get back with you prior to you reaching out to [place] for questions.  This will usually take about 72 hours for labs and 5-7 days for procedure test results.

## 2023-03-17 NOTE — Progress Notes
Faxed OV note to [place], [place] at [phone] and Dr. Elray Buba from surgery for cardiac clearance

## 2023-03-17 NOTE — Progress Notes
Cardiovascular Medicine       Date of Service: 03/17/2023      HPI     Sandy Gaines is a 47 y.o. female who was seen today in the Cardiovascular Medicine Clinic at [facility] at our [facility] office.     She presents today for preoperative restratification.  She is a patient of Dr. Wallene Huh.    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  She was originally diagnosed with a supraventricular tachycardia with rates up to 220 beats per minute and was taken to EP study and ablation in February 2017 by Dr. Ethelene Hal in [facility].  She had another ablation at [facility] in 2018 for focal atrial tachycardia.  She was started on flecainide and atenolol but this was discontinued by the patient sometime in 2022.  She continues to have palpitations and on a recent visit with her primary care physician, and ECG showed frequent premature ventricular contractions so a Holter monitor was ordered.  On reviewing this, it shows 5.8% PVC burden and 2 very short runs of atrial tachycardia but otherwise normal sinus rhythm.    She is fairly asymptomatic.  She is active and has no limitations to activity from chest pain or shortness of breath.  Her activity is mostly limited by shoulder pain.  She denies any frequent lightheadedness, recent syncope.  She reportedly had a stress test several years ago and was on flecainide and atenolol.  Per Dr. Johnanna Schneiders most recent note, if she would require flecainide again, a stress test should be performed prior.         ECG: Normal sinus rhythm    Transthoracic echocardiogram: 2017  . Normal left ventricular size. Normal left ventricular systolic function. LV Ejection   Fraction was 65 %. The left ventricular ejection fraction was an estimation.   2. The mitral valve demonstrates normal function with trace physiologic regurgitation.   3. Normal pericardium with no significant pericardial effusion.         Holter 03/15:  1.  Normal sinus rhythm  2.  PVCs is 5.8% burden  3.  Infrequent PACs with 2 short runs of atrial tachycardia        Assessment & Plan   47 y.o. female patient with the following medical problems:    Preoperative restratification.  History of supraventricular tachycardia.  Premature ventricular contractions.  History of neurocardiogenic syncope.  History of tobacco abuse.    Given good functional status (greater than 4 METS) and no limiting symptoms, no further cardiac testing is required to evaluate preoperative cardiovascular risk.  RCRI: Class I risk (3.9% 30-day risk of death, MI, cardiac arrest)  Will order a treadmill stress MPI in order to guide antiarrhythmic therapy.  Given that she continues to have premature ventricular contractions, she would likely benefit from restarting flecainide.  In the interim, will start her on metoprolol succinate 25 mg daily.  This should not preclude her from getting surgery and can be performed after her shoulder surgery if required.    Will have her follow-up with Dr. Wallene Huh to further discuss management of PVCs.         Past Medical History  Patient Active Problem List    Diagnosis Date Noted    Atrial tachycardia (HCC)  09/23/2017     09/23/17 Atach RFA - Ablation seems to suppress the tachycardia but does not eliminate it. Several ablation lesions with adequate power delivered. Patient has significant pain. Still has NS PAT for few beats at a time. Study terminated. Continue Flecainide and Atenolol  03/05/2023-Holter Monitor:Normal sinus rhythm. PVCs is 5.8% burden. Infrequent PACs with 2 short runs of atrial tachycardia.       Atrial fibrillation (HCC) 04/28/2017    SVT (supraventricular tachycardia) (HCC) 04/28/2017     01/24/2016 EP study with RFA by Dr. Ethelene Hal at [facility]       Mitral valve prolapse 04/28/2017     01/24/2016 Echo Normal left ventricular size.  Normal left ventricular systolic function.  EF 65%.  Mitral valve demonstrates normal function with trace physiologic regurgitation.       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     Review of Systems  14 point review of systems negative except as above.    Vitals:    03/17/23 0739   BP: 120/80   Pulse: 76   SpO2: 96%   PainSc: Zero   Weight: 68 kg (150 lb)   Height: 172.7 cm (5' 8)     Body mass index is 22.81 kg/m?Marland Kitchen     Physical Exam  General Appearance: no acute distress  HEENT: EOMI, mucous membranes moist, oropharynx is clear  Neck Veins: neck veins are flat & not distended  Carotid Arteries: no bruits  Chest Inspection: chest is normal in appearance  Auscultation/Percussion: lungs clear to auscultation, no rales, rhonchi, or wheezing  Cardiac Rhythm: regular rhythm & normal rate  Cardiac Auscultation: Normal S1 & S2, no S3 or S4, no rub  Murmurs: no cardiac murmurs  Abdominal Exam: soft, non-tender, normal bowel sounds, no masses or bruits  Abdominal aorta: nonpalpable   Liver & Spleen: no organomegaly  Extremities: no lower extremity edema; palpable distal pulses  Skin: warm & intact  Neurologic Exam: oriented to time, place and person; no focal neurologic deficits       Cardiovascular Studies        Cardiovascular Health Factors  Vitals BP Readings from Last 3 Encounters:   03/17/23 120/80   04/27/22 122/66   01/01/20 126/76     Wt Readings from Last 3 Encounters:   03/17/23 68 kg (150 lb)   04/27/22 65.6 kg (144 lb 9.6 oz)   01/01/20 58.5 kg (129 lb)     BMI Readings from Last 3 Encounters:   03/17/23 22.81 kg/m?   04/27/22 21.99 kg/m?   01/01/20 19.61 kg/m? Smoking Social History     Tobacco Use   Smoking Status Every Day    Current packs/day: 1.00    Average packs/day: 1 pack/day for 15.0 years (15.0 ttl pk-yrs)    Types: Cigarettes   Smokeless Tobacco Never      Lipid Profile Cholesterol   Date Value Ref Range Status   12/08/2021 202 (H) <=200 Final     HDL   Date Value Ref Range Status   12/08/2021 63  Final     LDL   Date Value Ref Range Status   12/08/2021 145 (H) <=100 Final     Triglycerides  Date Value Ref Range Status   12/08/2021 64  Final      Blood Sugar Hemoglobin A1C   Date Value Ref Range Status   12/08/2021 4.9  Final     Glucose   Date Value Ref Range Status   12/08/2021 93  Final   07/21/2018 85  Final   09/24/2017 102 (H) 70 - 100 MG/DL Final        ASCVD Risk Assessment:     ASCVD 10-year risk calculated: The 10-year ASCVD risk score (Arnett DK, et al., 2019) is: 2%*    Values used to calculate the score:      Age: 67 years      Sex: Female      Is Non-Hispanic African American: No      Diabetic: No      Tobacco smoker: Yes      Systolic Blood Pressure: 120 mmHg      Is BP treated: No      HDL Cholesterol: 63 mg/dL*      Total Cholesterol: 202 mg/dL*      * - Cholesterol units were assumed for this score calculation     LDL 70-189, if ASCVD 10-y risk is >7.5%, high to moderate-intensity statin therapy is recommended  Diabetes with ASCVD 10-y risk >7.5%, high-intensity statin therapy is recommended.  Diabetes with ASCVD 10-y risk <7.5%, moderate-intensity statin therapy is recommended.      Current Medications (including today's revisions)   CHOLEcalciferoL (vitamin D3) (VITAMIN D-3) 5000 unit tablet Take one tablet by mouth daily.    cyclobenzaprine (FLEXERIL) 10 mg tablet Take one tablet by mouth as Needed.    duloxetine DR (CYMBALTA) 60 mg capsule Take two capsules by mouth every morning.         Orpah Cobb MD  Cardiovascular Medicine.

## 2023-04-26 ENCOUNTER — Encounter: Admit: 2023-04-26 | Discharge: 2023-04-26 | Payer: BC Managed Care – PPO

## 2023-05-03 ENCOUNTER — Encounter: Admit: 2023-05-03 | Discharge: 2023-05-03 | Payer: BC Managed Care – PPO

## 2023-05-24 IMAGING — CR [ID]
3 series · 3 of 3 positions shown · non-contrast
Comparison: none

[x foot ap right]
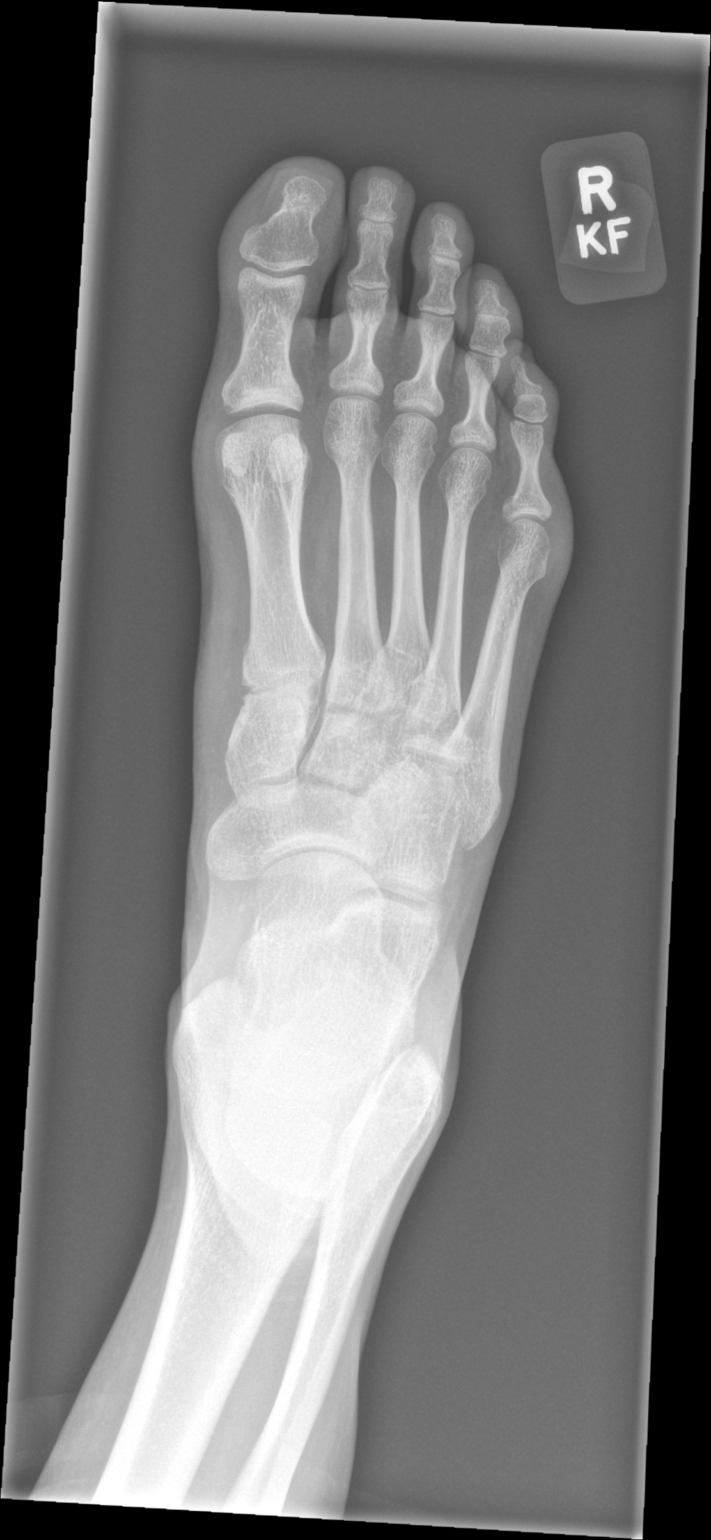

[x foot obl right]
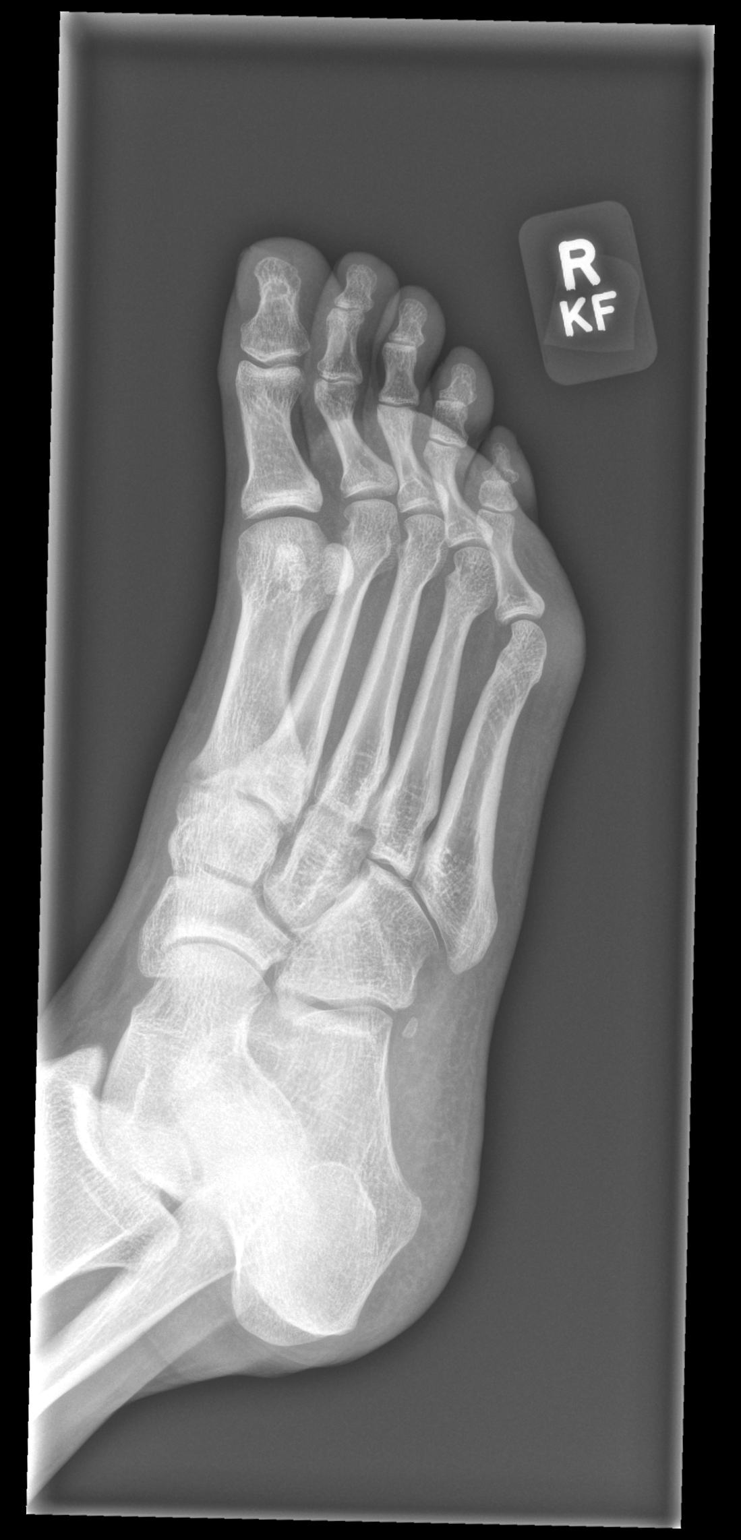

[x foot lat right]
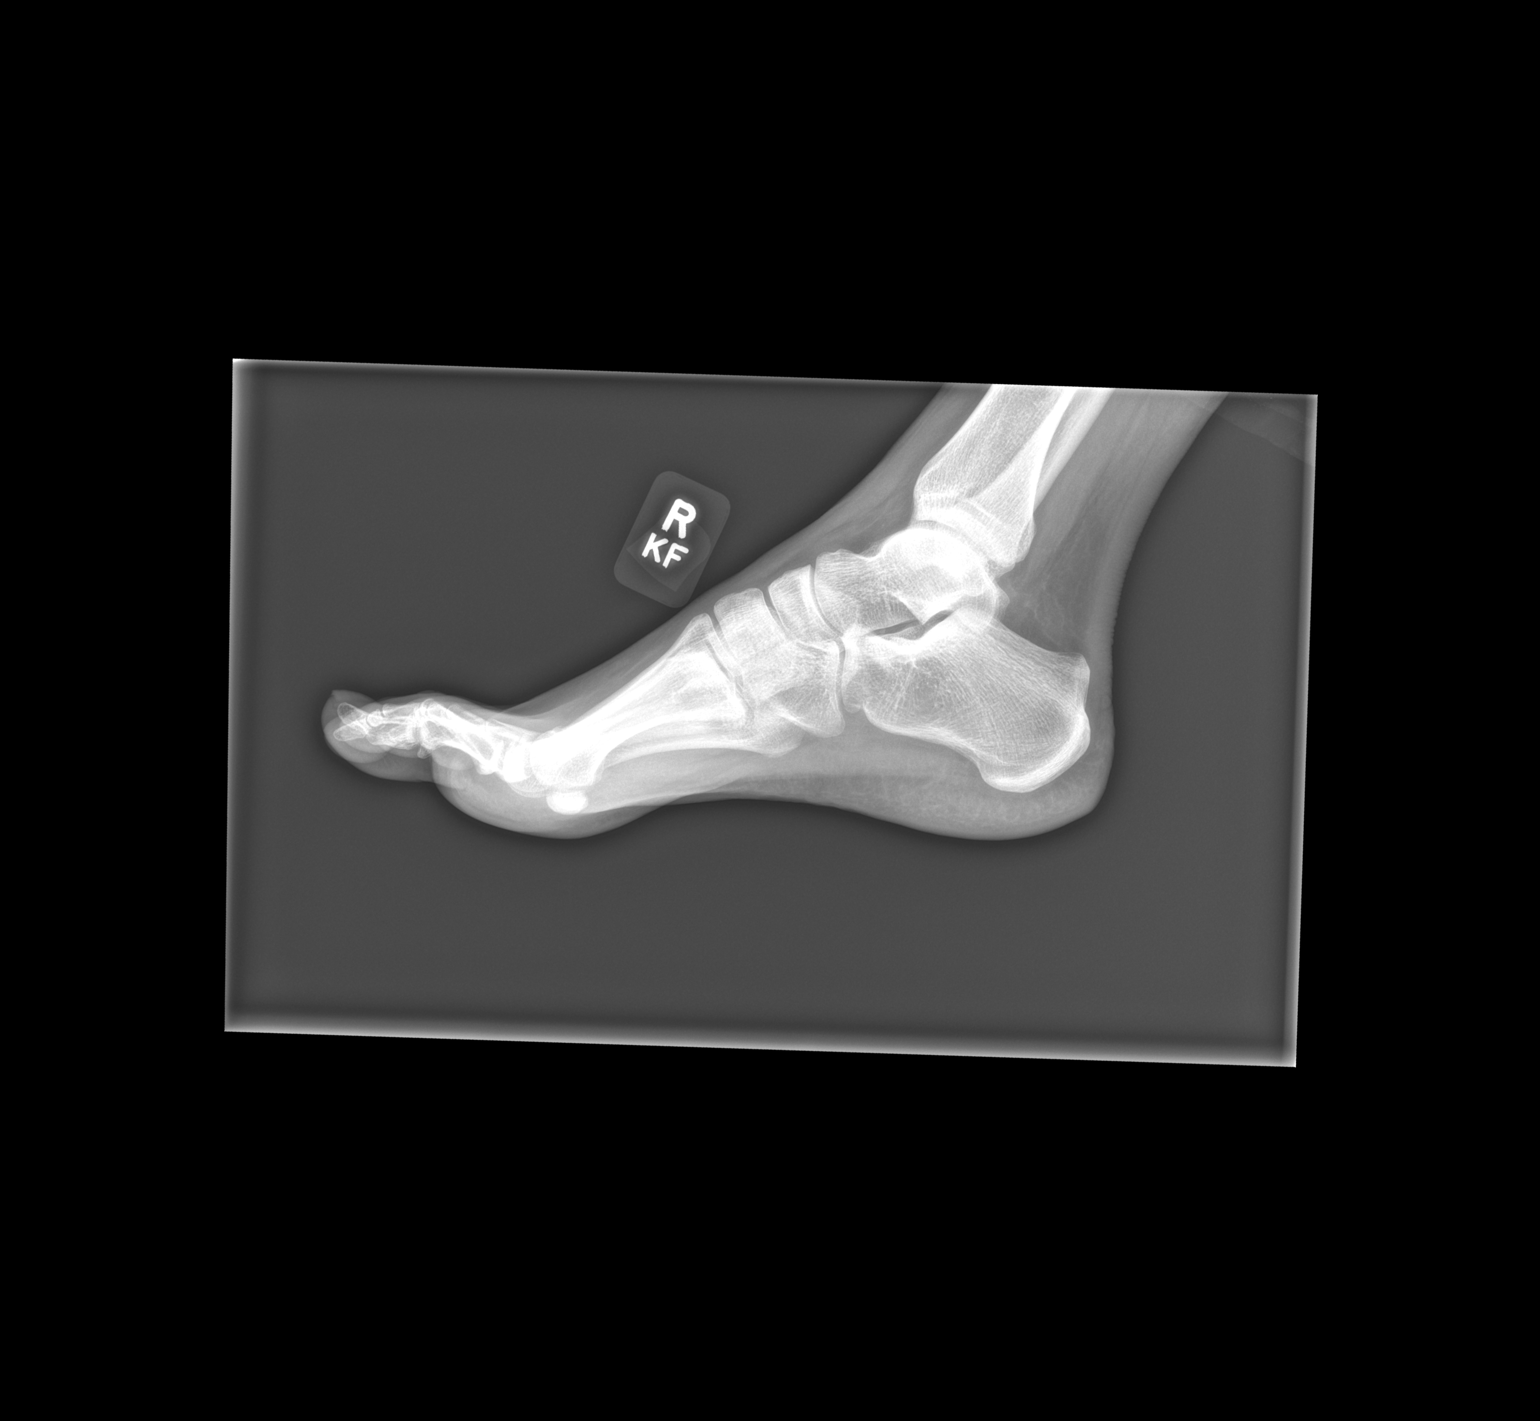

[3 of 3 positions shown; findings below may reference images not displayed]

EXAM

XR foot RT min 3V

INDICATION

stumbled down steps, pain in arch/Tess toe
stumbled down steps [REDACTED], pain in arch of foot and into great toe, hx of golfball size callous
removed from top of foot.

TECHNIQUE

Three views of the right foot

COMPARISONS

None available at the time of dictation.

FINDINGS

No radiographic evidence of an acute fracture, osseous malalignment, or aggressive focal osseous
lesion. There is anatomic joint alignment.

IMPRESSION
1. No radiographic evidence of an acute osseous abnormality.

Tech Notes:

stumbled down steps [REDACTED], pain in arch of foot and into great toe, hx of golfball size callous
removed from top of foot.

## 2023-08-18 ENCOUNTER — Encounter: Admit: 2023-08-18 | Discharge: 2023-08-18 | Payer: BC Managed Care – PPO

## 2024-03-29 ENCOUNTER — Encounter: Admit: 2024-03-29 | Discharge: 2024-03-29

## 2024-06-15 ENCOUNTER — Encounter: Admit: 2024-06-15 | Discharge: 2024-06-15 | Payer: BLUE CROSS/BLUE SHIELD

## 2024-09-10 ENCOUNTER — Encounter: Admit: 2024-09-10 | Discharge: 2024-09-10 | Payer: BLUE CROSS/BLUE SHIELD

## 2024-11-02 ENCOUNTER — Encounter: Admit: 2024-11-02 | Discharge: 2024-11-02 | Payer: BLUE CROSS/BLUE SHIELD

## 2024-11-02 ENCOUNTER — Ambulatory Visit: Admit: 2024-11-02 | Discharge: 2024-11-02 | Payer: BLUE CROSS/BLUE SHIELD

## 2024-11-02 VITALS — BP 98/72 | HR 72 | Ht 68.0 in | Wt 143.0 lb

## 2024-11-02 DIAGNOSIS — Z720 Tobacco use: Secondary | ICD-10-CM

## 2024-11-02 DIAGNOSIS — I493 Ventricular premature depolarization: Secondary | ICD-10-CM

## 2024-11-02 DIAGNOSIS — E7800 Pure hypercholesterolemia, unspecified: Secondary | ICD-10-CM

## 2024-11-02 NOTE — Patient Instructions [37]
 Follow-Up:    -Thank you for allowing us  to participate in your care today. Your After Visit Summary is being completed by Rocky Fisher, BSN.    -We would like you to follow up in 3 months with Leonor Jay, APRN  -Schedule before leaving or call [phone].    -Please schedule the following testing at check out, or by calling our scheduling line: ECHOCARDIOGRAM    Changes From Today's Office Visit   NO changes today    Contacting our office:    -Business Hours: Monday-Friday, 8:00 am-4:30 pm (excluding Holidays).     -For medical questions or concerns, please send us  a message through your MyChart account or call the [facility] team nursing triage line at [phone]. Please leave a detailed message with your name, date of birth, and reason for your call.  If your message is received before 3:30pm, every effort will be made to call you back the same day.  Please allow time for us  to review your chart prior to call back.     -For medication refills please start by contacting your pharmacy. You can also send us  a prescription question through your MyChart or call the nurse triage line above.     -Please allow a minimum of 10-14 business days for clearance from your cardiologist for procedures and/or FMLA, disability, or  DOT forms. An office visit or testing may be required for us  to provide clearance.     [facility] nursing team fax number: [phone]    -You may receive a survey in the upcoming weeks from [facility]. Your feedback is important to us  and helps us  continue to improve patient care and patient satisfaction.     -Please feel free to call our Financial Department at [phone] with any questions or concerns about estimated cost of testing or imaging ordered today. We are happy to provide CPT codes upon request.    Results & Testing Follow Up:    -Please allow 5-7 business days for the results of any testing to be reviewed. Please call our office if you have not heard from a nurse within this time frame.    -Should you choose to complete testing at an outside facility, please contact our office after completion of testing so that we can ensure that we have received results for your provider to review.    Lab and test results:  As a part of the CARES act, starting 03/21/2020, some results will be released to you via MyChart immediately and automatically.  You may see results before your provider sees them; however, your provider will review all these results and then they, or one of their team, will notify you of result information and recommendations.   Critical results will be addressed immediately, but otherwise, please allow us  time to get back with you prior to you reaching out to us  for questions.  This will usually take about 72 hours for labs and 5-7 days for procedure test results.

## 2024-11-02 NOTE — Progress Notes [1]
 Ambulatory (External) Cardiac Monitor Enrollment Record     Placement Location: Clinic Placement  Clinic Location: MPB5  Vendor: iRhythm (Z[employer])  No data recordedDuration of Monitor (in days): 14  Monitor Diagnosis: PVC (premature ventricular contraction) (I49.3)  No data recordedOrdering Provider: Ashutosh Bapat  AMB Monitor Serial Number: [id number]  AMB Monitor Expiration Date: 03/25/25  No data recordedNo data recorded    Start Time and Date: 11/02/24 12:04 PM   Patient Name: Sandy Gaines  DOB: [birth year]  MRN: [id number]  Sex: female  Mobile Phone Number: [phone] (mobile)  Home Phone Number: [phone]  Patient Address: [address]  [place] [address]  Insurance Coverage: [facility] OUT OF STATE  Insurance ID: [id number]  Insurance Group #: [id number]  Insurance Subscriber: [facility] DIANE  Implanted Cardiac Device Information: No results found for: EPDEVTYP      Patient instructed to contact company phone number on the monitor box with questions regarding billing, placement, troubleshooting.     Sandy Gaines    ____________________________________________________________    Clinic Staff:    Complete additional steps for documentation double check/Co-Sign.  In Follow-up, send chart upon closing encounter to P [facility] HRM AMBULATORY MONITORS    HRM Ambulatory Monitoring Team:  Schedule on appropriate template and check-in.   Clinic Placement Schedule on clinic location [facility] schedule   Home Enrollment Schedule on Home Enrollment schedule ([facility] R[facility]THM)   Given to patient in clinic for self-placement Schedule on Home Enrollment schedule ([facility] R[facility]M)   Inpatient Schedule on [place] [facility] AMBULATORY MONITORING template   2. Please enroll with appropriate vendor.

## 2024-11-02 NOTE — Progress Notes [1]
 Date of Service: 11/02/2024      CHIEF COMPLAINT: Preop risk stratification.         HISTORY OF PRESENT ILLNESS:  Sandy Gaines is a 48 y.o. female.     Patient was seen in March 2024 by Dr. Elon.  Records of that evaluation were reviewed.  She has had a history of SVT in the past and frequent PVCs and at the time of her last visit, a treadmill stress MPI was ordered and Toprol -XL was started.  She did not have a stress test but did complete her shoulder surgery and states that she has another surgery coming up since she has got still limitations about using her shoulder.    She experiences palpitations two to three times a week, with each episode lasting about 45 seconds. These episodes can occur multiple times within a 10-15 minute span and then not recur for four to five days. The frequency of these episodes has remained consistent over time.  Approximately a month and a half ago, she experienced a severe episode that included a sudden headache and tunnel vision, prompting her husband to take her to the ER in [place], where she was restarted on metoprolol . Since resuming metoprolol , she has not had any major episodes or instances of blacking out.  Her past cardiac workup includes wearing a heart monitor for a month, followed by another monitoring three months later, with the last monitoring occurring over a year ago. Previous findings included atrial tachycardia and PVCs with a burden of around six percent.  In terms of social history, she has been unable to quit smoking completely, although she reduced her smoking and quit for about three months a year ago. Her husband smokes two packs a day, which she finds challenging in her efforts to quit.  Family history is significant for her mother having had several strokes.  No chest pressure, tightness, or shortness of breath during exertion, such as walking or climbing stairs. She also reports no swelling in her legs.      PMH (CARDIAC AND RELATED):    SVT    - S/p ablation at outside facility in 2017 and repeat ablation for atrial tachycardia in 2018 at [facility].  History of atrial fibrillation, details unavailable  History of frequent PVCs.  Syncope thought to be neurocardiogenic in origin.  Tobacco use disorder    CARDIOVASCULAR TESTS    EP / MONITORING:  March 2024.  Outside report from [facility] received.  Normal sinus rhythm with average heart rate of 80 bpm.  Minimum heart rate of 45 bpm.  PVC burden of 5.8%.  Infrequent PACs with 2 short runs of atrial tachycardia.      EKG  11/02/2024.  Reviewed.  Normal sinus rhythm with baseline wander artifact.  No significant ST-T wave changes.         CURRENT MEDICATIONS: (including today's revisions)     CHOLEcalciferoL (vitamin D3) (VITAMIN D-3) 5000 unit tablet Take one tablet by mouth daily.    cyclobenzaprine (FLEXERIL) 10 mg tablet Take one tablet by mouth as Needed.    duloxetine DR (CYMBALTA) 60 mg capsule Take two capsules by mouth every morning.    metoprolol  succinate XL (TOPROL  XL) 25 mg extended release tablet Take one tablet by mouth daily.       ALLERGIES:  Allergies[1]      Vitals:    11/02/24 1121   O2 Device: None (Room air)     There is no height or  weight on file to calculate BMI.  There is no height or weight on file to calculate BSA.    BP Readings from Last 5 Encounters:   03/17/23 120/80   04/27/22 122/66   01/01/20 126/76   06/29/18 118/70   11/22/17 112/70     Wt Readings from Last 5 Encounters:   03/17/23 68 kg (150 lb)   04/27/22 65.6 kg (144 lb 9.6 oz)   01/01/20 58.5 kg (129 lb)   06/29/18 61.2 kg (135 lb)   11/22/17 61.2 kg (135 lb)         PHYSICAL EXAMINATION:  GENERAL: Patient is resting comfortably, in no apparent distress, alert and oriented.   NECK: No jugular venous distention.    CHEST: Chest is clear to auscultation bilaterally with no wheezes, crackles or rhonchi.  CARDIOVASCULAR: S1 and S2 are normal and regular.  No murmur auscultated.   EXTREMITIES: No edema bilaterally.      LABS:  Available and relevant labs from EMR and outside facility were reviewed      Hemoglobin   Date Value Ref Range Status   12/08/2021 14.4  Final     Platelet Count   Date Value Ref Range Status   12/08/2021 290  Final     Creatinine   Date Value Ref Range Status   12/08/2021 0.67  Final     Potassium   Date Value Ref Range Status   12/08/2021 4.0  Final     TSH   Date Value Ref Range Status   12/08/2021 1.471  Final     AST (SGOT)   Date Value Ref Range Status   12/08/2021 25  Final     ALT (SGPT)   Date Value Ref Range Status   12/08/2021 18  Final     Magnesium    Date Value Ref Range Status   12/08/2021 1.9  Final        Cholesterol   Date Value Ref Range Status   12/08/2021 202 (H) <=200 Final     HDL   Date Value Ref Range Status   12/08/2021 63  Final     LDL   Date Value Ref Range Status   12/08/2021 145 (H) <=100 Final     Triglycerides   Date Value Ref Range Status   12/08/2021 64  Final                ASSESSMENT:    1. Cardiovascular symptoms        PLAN and RECOMMENDATIONS:    1.  Atrial tachycardia/history of SVT/frequent PVCs/history of possible atrial fibrillation  The patient has had a atrial tachycardia ablation in 2018 with a prior SVT ablation in 2017.  On her Holter monitor about a year ago, she was noted to have frequent PVCs with PVC burden of 5.8%.  Prior office visits have included discussion about starting antiarrhythmic therapy with flecainide .  She had been on flecainide  in the past but states that she did not tolerate this well.  She was recommended to have a stress test prior to starting a class Ic antiarrhythmic but she has not completed the stress test.  She has been started on beta-blocker and was off of beta-blocker but resumed about a month ago after an episode of palpitations.  Her symptoms of palpitations have remained stable since.  We will reassess frequency of arrhythmias with a 14-day Zio patch monitor.  Continue Toprol -XL for now.  Will get an echocardiogram to assess LV  function.  If antiarrhythmic therapy is needed, she may need further evaluation for CAD.  She does not have any significant symptoms of exertional chest pain or chest tightness at this time.    2.  Hypercholesterolemia  The patient does have significant hypercholesterolemia and evaluation for treatment with statins is indicated.  Coronary calcium score would be a useful test to do and we will pursue this at the time of her next visit.    3.  Shoulder surgery  She has an upcoming shoulder surgery.  No further preoperative testing is needed prior to this.  She did undergo all major surgery last year and tolerated this well.  She has no symptoms of angina.       follow-up with [facility] in 3 months, or earlier if needed      Thank you for allowing me to take part in this patient's care. Please not hesitate to contact me with questions or concerns.     Junita LULLA Bachelor, MD, [facility], FASE  Department of Cardiovascular Medicine           Total time spent on today's encounter was 45 minutes.   This includes 15 minutes today for preparing to see patient, chart review, review of outside records as needed, review of pertinent  labs, independently interpreting results of radiologic studies and cardiovascular studies, provider notes, 20 mins for performing a medically appropriate examination, face-to-face visit with patient (and family) for formulation and discussion of treatment plan,  prescription drug management, and 10 mins post visit for documenting clinical information in the EMR and for co-ordination of care.    Patient was instructed to followup with PCP for routine medical care and health maintenance and screening studies.  Prescription drug management:   Patient's current medication list was reviewed and reconciled. Continue current medications with changes, if any detailed above. Refills provided as needed until next advised office visit.    This note was dictated using the [employer].  Transcription errors may occur with the use of this software. Editing and proofreading were done by the author of this document.  In spite of the author's best effort to identify every error introduced by voice to text dictation, some errors that may represent misspelling or misstatements of what was dictated may persist.  If there are questions about content in this document please contact Dr. Junita Bachelor.    There are no Patient Instructions on file for this visit.              [1] No Known Allergies

## 2024-11-02 NOTE — Telephone Encounter [36]
 11/02/2024 12:31 PM   Received call from patient's workmans comp case manager Olam requesting that cardiac clearance for surgery be faxed to [phone] [facility] ortho) as well as her at [phone].  Callback with questions at [phone].  Letter written from Dr. Neldon office note and faxed as requested.    3.  Shoulder surgery  She has an upcoming shoulder surgery.  No further preoperative testing is needed prior to this.  She did undergo all major surgery last year and tolerated this well.  She has no symptoms of angina.

## 2024-11-02 NOTE — Progress Notes [1]
 I have served as the witness to ambulatory cardiac monitor placement. I have reviewed the vendor and serial number of the monitor provided to Sandy Gaines and verified that the information documented in the ambulatory monitor flowsheet is accurate and complete.     Rocky Fisher, BSN

## 2024-11-03 ENCOUNTER — Ambulatory Visit: Admit: 2024-11-03 | Discharge: 2024-11-02 | Payer: BLUE CROSS/BLUE SHIELD

## 2024-11-03 DIAGNOSIS — I4719 Other supraventricular tachycardia: Secondary | ICD-10-CM

## 2024-11-03 DIAGNOSIS — R0989 Other specified symptoms and signs involving the circulatory and respiratory systems: Principal | ICD-10-CM

## 2024-11-24 ENCOUNTER — Encounter: Admit: 2024-11-24 | Discharge: 2024-11-24 | Payer: BLUE CROSS/BLUE SHIELD

## 2024-11-24 DIAGNOSIS — I471 SVT (supraventricular tachycardia): Principal | ICD-10-CM

## 2024-11-24 NOTE — Telephone Encounter [36]
.  Urgent Zio report uploaded and assigned to Dr. RONAL Bachelor  Referring Physician: Dr. RONAL Bachelor    Preliminary Findings Prepared by Kellie Brush, 11/24/24  Patient had a min HR of 48 bpm, max HR of 273 bpm, and avg HR of 84 bpm.  Predominant underlying rhythm was Sinus Rhythm. 52 Supraventricular Tachycardia  runs occurred, the run with the fastest interval lasting 12.0 secs with a max rate of  273 bpm, the longest lasting 1 min 41 secs with an avg rate of 202 bpm. True  duration of Supraventricular Tachycardia difficult to ascertain due to artifact.  Supraventricular Tachycardia was detected within +/- 45 seconds of symptomatic  patient event(s). Isolated SVEs were rare (<1.0%, 5093), SVE Couplets were rare  (<1.0%, 52), and SVE Triplets were rare (<1.0%, 13). Isolated VEs were rare  (<1.0%, 3407), VE Couplets were rare (<1.0%, 1), and no VE Triplets were present.  Ventricular Bigeminy was present. MD notification criteria for Supraventricular  Tachycardia met - report posted prior to notification (RJDM).

## 2024-11-24 NOTE — Telephone Encounter [36]
 Received call on the [employer] phone from [place] at [facility] with urgent notification of SVT HR 183 that lasted 30 seconds and occurred on 11/09/24 at 8:21 pm. This event is on page 18 Strip # 7.     Routing to C[facility] Nurse Teal team for review.

## 2024-11-24 NOTE — Telephone Encounter [36]
 11/24/2024 11:19 AM   Spoke with pt to f/u on heart monitor results and discussed. Heart monitor was placed on 11/02/24 which was day of OV with Dr. Neldon. Pt reports monitor was removed on 11/18 as this was the day she had shoulder surgery. Pt reports she felt periods of her heart racing on day of wearing monitor and over the next 3-4 days. She is not sure if stress was trigger d/t her upcoming shoulder surgery when wearing monitor. Pt denied any other symptoms during these events and just felt her pulse racing for no reason. She reports she has not had any further episodes for the past week. She reports she is taking metoprolol  XL 25mg  twice a day and not once a day as noted on the medication list. She doesn't monitor BP/HR at home. She has echo appt on 12/8 and f/u appt with [facility] on 02/05/27. Encouraged to continue with metoprolol  medication as she is taking and to keep watch for any reoccurrence of episodes or symptoms. Encouraged to keep watch on BP/HR trends at times if able to do so. Will send msg to Dr. Neldon to update and to review if any further recs in the meantime. Pt voiced understanding.

## 2024-11-27 ENCOUNTER — Ambulatory Visit: Admit: 2024-11-27 | Discharge: 2024-11-27 | Payer: BLUE CROSS/BLUE SHIELD

## 2024-11-27 ENCOUNTER — Encounter: Admit: 2024-11-27 | Discharge: 2024-11-27 | Payer: BLUE CROSS/BLUE SHIELD

## 2024-11-28 ENCOUNTER — Encounter: Admit: 2024-11-28 | Discharge: 2024-11-28 | Payer: BLUE CROSS/BLUE SHIELD

## 2025-01-15 ENCOUNTER — Encounter: Admit: 2025-01-15 | Discharge: 2025-01-15 | Payer: BLUE CROSS/BLUE SHIELD

## 2025-01-18 ENCOUNTER — Encounter: Admit: 2025-01-18 | Discharge: 2025-01-18 | Payer: BLUE CROSS/BLUE SHIELD
